# Patient Record
Sex: Female | Born: 1985 | Race: Black or African American | Hispanic: No | Marital: Married | Smoking: Never smoker
Health system: Southern US, Community
[De-identification: ages and names within clinical notes are randomized; demographics above are authoritative.]

## PROBLEM LIST (undated history)

## (undated) DIAGNOSIS — O009 Unspecified ectopic pregnancy without intrauterine pregnancy: Secondary | ICD-10-CM

## (undated) HISTORY — PX: TUBAL LIGATION: SHX77

---

## 2017-05-04 DIAGNOSIS — O009 Unspecified ectopic pregnancy without intrauterine pregnancy: Secondary | ICD-10-CM

## 2017-05-04 HISTORY — DX: Unspecified ectopic pregnancy without intrauterine pregnancy: O00.90

## 2018-07-10 ENCOUNTER — Other Ambulatory Visit: Payer: Self-pay

## 2018-07-10 ENCOUNTER — Encounter (HOSPITAL_COMMUNITY): Payer: Self-pay | Admitting: *Deleted

## 2018-07-10 ENCOUNTER — Emergency Department (HOSPITAL_COMMUNITY)
Admission: EM | Admit: 2018-07-10 | Discharge: 2018-07-10 | Disposition: A | Discharge status: Home / Self Care | Payer: Self-pay | Attending: Emergency Medicine | Admitting: Emergency Medicine

## 2018-07-10 DIAGNOSIS — N912 Amenorrhea, unspecified: Secondary | ICD-10-CM | POA: Insufficient documentation

## 2018-07-10 HISTORY — DX: Unspecified ectopic pregnancy without intrauterine pregnancy: O00.90

## 2018-07-10 LAB — URINALYSIS, ROUTINE W REFLEX MICROSCOPIC
Bilirubin Urine: NEGATIVE
Glucose, UA: NEGATIVE mg/dL
Hgb urine dipstick: NEGATIVE
Ketones, ur: NEGATIVE mg/dL
LEUKOCYTE UA: NEGATIVE
NITRITE: NEGATIVE
Protein, ur: NEGATIVE mg/dL
Specific Gravity, Urine: 1.019 (ref 1.005–1.030)
pH: 6 (ref 5.0–8.0)

## 2018-07-10 LAB — POC URINE PREG, ED: PREG TEST UR: NEGATIVE

## 2018-07-10 NOTE — Discharge Instructions (Signed)
The pregnancy test today was positive.  It may be helpful to repeat a home pregnancy test in a week or two. Follow-up with OB/GYN for any further assessment or management of your symptoms.

## 2018-07-10 NOTE — ED Provider Notes (Signed)
[facility] EMERGENCY DEPARTMENT Provider Note   CSN: [id number] Arrival date & time: 07/10/18  1331    History   Chief Complaint Chief Complaint  Patient presents with  . Possible Pregnancy    HPI Jean Nichols is a 33 y.o. female.     HPI   Jean Nichols is a 33 y.o. female, with a history of tubal ligation, presenting to the ED with missed menstrual cycle.  LMP January 25 and states she is typically regular.  Endorses breast tenderness, nausea, and urinary frequency for the last week. She thinks she may be pregnant, but has not taken a pregnancy test. Patient had a tubal ligation 5 years ago, however, she states she became pregnant October 2019 and resulted in an induced abortion.  Denies fever/chills, vomiting, diarrhea, abdominal pain, vaginal bleeding, abnormal vaginal discharge, or any other complaints.  Past Medical History:  Diagnosis Date  . Ectopic pregnancy 2019    Patient Active Problem List   Diagnosis Date Noted  . Ectopic pregnancy 05/04/2017    History reviewed. No pertinent surgical history.   OB History    Gravida  1   Para      Term      Preterm      AB      Living        SAB      TAB      Ectopic      Multiple      Live Births               Home Medications    Prior to Admission medications   Not on File    Family History History reviewed. No pertinent family history.  Social History Social History   Tobacco Use  . Smoking status: Never Smoker  . Smokeless tobacco: Never Used  Substance Use Topics  . Alcohol use: Never    Frequency: Never  . Drug use: Never     Allergies   Patient has no allergy information on record.   Review of Systems Review of Systems  Constitutional: Negative for chills, diaphoresis and fever.  Respiratory: Negative for shortness of breath.   Cardiovascular: Negative for chest pain.  Gastrointestinal: Positive for nausea. Negative for abdominal pain,  diarrhea and vomiting.  Genitourinary: Positive for frequency and menstrual problem. Negative for vaginal bleeding and vaginal discharge.  Musculoskeletal: Negative for back pain.  All other systems reviewed and are negative.    Physical Exam Updated Vital Signs BP 118/69   Pulse 99   Temp 98.1 F (36.7 C) (Oral)   Resp 16   SpO2 99%   Physical Exam Vitals signs and nursing note reviewed.  Constitutional:      General: She is not in acute distress.    Appearance: She is well-developed. She is not diaphoretic.  HENT:     Head: Normocephalic and atraumatic.     Mouth/Throat:     Mouth: Mucous membranes are moist.     Pharynx: Oropharynx is clear.  Eyes:     Conjunctiva/sclera: Conjunctivae normal.  Neck:     Musculoskeletal: Neck supple.  Cardiovascular:     Rate and Rhythm: Normal rate and regular rhythm.     Pulses: Normal pulses.     Comments: Tactile temperature in the extremities appropriate and equal bilaterally. Pulmonary:     Effort: Pulmonary effort is normal. No respiratory distress.  Abdominal:     Palpations: Abdomen is soft.     Tenderness:  There is no abdominal tenderness. There is no guarding.  Musculoskeletal:     Right lower leg: No edema.     Left lower leg: No edema.  Lymphadenopathy:     Cervical: No cervical adenopathy.  Skin:    General: Skin is warm and dry.  Neurological:     Mental Status: She is alert.  Psychiatric:        Mood and Affect: Mood and affect normal.        Speech: Speech normal.        Behavior: Behavior normal.      ED Treatments / Results  Labs (all labs ordered are listed, but only abnormal results are displayed) Labs Reviewed  URINALYSIS, ROUTINE W REFLEX MICROSCOPIC  POC URINE PREG, ED    EKG None  Radiology No results found.  Procedures Procedures (including critical care time)  Medications Ordered in ED Medications - No data to display   Initial Impression / Assessment and Plan / ED Course  I  have reviewed the triage vital signs and the nursing notes.  Pertinent labs & imaging results that were available during my care of the patient were reviewed by me and considered in my medical decision making (see chart for details).        Patient presents with a complaint of a missed menstrual cycle accompanied by nausea and breast tenderness.  Negative pregnancy test.  UA unremarkable.  OB/GYN follow-up for any further management.  Return precautions discussed. Patient voices understanding of these instructions, accepts the plan, and is comfortable with discharge.  Final Clinical Impressions(s) / ED Diagnoses   Final diagnoses:  Amenorrhea    ED Discharge Orders    None       Concepcion Living 07/10/18 1458    Linwood Dibbles, MD 07/12/18 [phone]

## 2018-07-10 NOTE — ED Triage Notes (Addendum)
PT presents today for pregnancy test.  LMP first of FEB. 2020. Pt reports she has had a tubal ligation. Pt reports Breast tenderness and nausea. Pt reports a tubal pregnancy last year.

## 2018-07-10 NOTE — ED Notes (Signed)
Urine preg

## 2018-07-10 NOTE — ED Notes (Signed)
Patient verbalizes understanding of discharge instructions . Opportunity for questions and answers were provided . Armband removed by staff ,Pt discharged from ED. W/C  offered at D/C  and Declined W/C at D/C and was escorted to lobby by RN.  

## 2018-07-12 ENCOUNTER — Encounter: Payer: Self-pay | Admitting: Internal Medicine

## 2018-07-12 ENCOUNTER — Telehealth: Payer: Self-pay | Admitting: Internal Medicine

## 2018-07-12 ENCOUNTER — Encounter (HOSPITAL_COMMUNITY): Payer: Self-pay | Admitting: *Deleted

## 2018-07-12 ENCOUNTER — Emergency Department (HOSPITAL_COMMUNITY)
Admission: EM | Admit: 2018-07-12 | Discharge: 2018-07-13 | Disposition: A | Discharge status: Home / Self Care | Payer: Self-pay | Attending: Emergency Medicine | Admitting: Emergency Medicine

## 2018-07-12 ENCOUNTER — Ambulatory Visit (INDEPENDENT_AMBULATORY_CARE_PROVIDER_SITE_OTHER): Payer: Self-pay | Admitting: Internal Medicine

## 2018-07-12 ENCOUNTER — Other Ambulatory Visit: Payer: Self-pay

## 2018-07-12 VITALS — BP 143/91 | HR 89 | Wt 176.0 lb

## 2018-07-12 DIAGNOSIS — Z5321 Procedure and treatment not carried out due to patient leaving prior to being seen by health care provider: Secondary | ICD-10-CM | POA: Insufficient documentation

## 2018-07-12 DIAGNOSIS — R109 Unspecified abdominal pain: Secondary | ICD-10-CM | POA: Insufficient documentation

## 2018-07-12 DIAGNOSIS — N912 Amenorrhea, unspecified: Secondary | ICD-10-CM

## 2018-07-12 LAB — CBC
HCT: 40.4 % (ref 36.0–46.0)
Hemoglobin: 13.7 g/dL (ref 12.0–15.0)
MCH: 29.5 pg (ref 26.0–34.0)
MCHC: 33.9 g/dL (ref 30.0–36.0)
MCV: 87.1 fL (ref 80.0–100.0)
PLATELETS: 267 10*3/uL (ref 150–400)
RBC: 4.64 MIL/uL (ref 3.87–5.11)
RDW: 12.7 % (ref 11.5–15.5)
WBC: 14 10*3/uL — AB (ref 4.0–10.5)
nRBC: 0 % (ref 0.0–0.2)

## 2018-07-12 LAB — URINALYSIS, ROUTINE W REFLEX MICROSCOPIC
Bilirubin Urine: NEGATIVE
Glucose, UA: NEGATIVE mg/dL
Hgb urine dipstick: NEGATIVE
Ketones, ur: NEGATIVE mg/dL
Leukocytes,Ua: NEGATIVE
NITRITE: NEGATIVE
Protein, ur: NEGATIVE mg/dL
Specific Gravity, Urine: 1.017 (ref 1.005–1.030)
pH: 6 (ref 5.0–8.0)

## 2018-07-12 LAB — COMPREHENSIVE METABOLIC PANEL
ALK PHOS: 114 U/L (ref 38–126)
ALT: 20 U/L (ref 0–44)
AST: 21 U/L (ref 15–41)
Albumin: 3.6 g/dL (ref 3.5–5.0)
Anion gap: 7 (ref 5–15)
BUN: 7 mg/dL (ref 6–20)
CO2: 24 mmol/L (ref 22–32)
Calcium: 9.2 mg/dL (ref 8.9–10.3)
Chloride: 105 mmol/L (ref 98–111)
Creatinine, Ser: 0.76 mg/dL (ref 0.44–1.00)
GFR calc Af Amer: >60 mL/min (ref >60)
GFR calc non Af Amer: >60 mL/min (ref >60)
Glucose, Bld: 151 mg/dL — ABNORMAL HIGH (ref 70–99)
Potassium: 3.6 mmol/L (ref 3.5–5.1)
Sodium: 136 mmol/L (ref 135–145)
Total Bilirubin: 0.4 mg/dL (ref 0.3–1.2)
Total Protein: 7.3 g/dL (ref 6.5–8.1)

## 2018-07-12 LAB — LIPASE, BLOOD: Lipase: 40 U/L (ref 11–51)

## 2018-07-12 LAB — I-STAT BETA HCG BLOOD, ED (MC, WL, AP ONLY): I-stat hCG, quantitative: <5 m[IU]/mL (ref <5)

## 2018-07-12 LAB — POCT PREGNANCY, URINE: Preg Test, Ur: NEGATIVE

## 2018-07-12 MED ORDER — SODIUM CHLORIDE 0.9% FLUSH: 3.0000 mL | Freq: Once | INTRAVENOUS | Status: DC

## 2018-07-12 NOTE — ED Triage Notes (Signed)
Pain in the midline stomach for several days. Reports being seen here for the same and had + pregnancy test. Hx of tubal ligation. Has had vomiting, no fevers. Went to [facility] today and they did blood work, will get results tomorrow.

## 2018-07-12 NOTE — Patient Instructions (Addendum)
We will call you with your lab results. We are checking a pregnancy hormone level in your blood as well as your thyroid hormone and hormones related to your ovaries.   If you have severe abdominal pain, heavy vaginal bleeding, significant nausea/vomiting you should seek care immediately.

## 2018-07-12 NOTE — Progress Notes (Signed)
   Subjective:    Jean Nichols - 33 y.o. female MRN [id number]  Date of birth: 09/24/1985  HPI  Jean Nichols is a 33 y.o. G3P3 female here for missed menses. Reports first day of LMP is 1/25. Patient is s/p bilateral tubal ligation. Reports menstrual periods have always been regular. She has never missed a period before without being pregnant. She reports breast tenderness, nausea and abdominal cramping; symptoms she feels is consistent with early pregnancy. She reports the cramping is midline and not particularly severe. She denies vaginal bleeding and vaginal discharge. Had recent ED visit on 3/8 with negative Upreg and negative UA. Patient denies history of hiristisum and acne. Denies history of thyroid disorders. Was last pregnant in 2015. Reports she last used DepoProvera in 2010. Otherwise, no hormonal contraception used.     OB History    Gravida  1   Para      Term      Preterm      AB      Living        SAB      TAB      Ectopic      Multiple      Live Births               -  reports that she has never smoked. She has never used smokeless tobacco. - Review of Systems: Per HPI. - Past Medical History: Patient Active Problem List   Diagnosis Date Noted  . Ectopic pregnancy 05/04/2017   - Medications: reviewed and updated   Objective:   Physical Exam BP (!) 143/91   Pulse 89   Wt 176 lb (79.8 kg)   BMI 32.19 kg/m  Gen: NAD, alert, cooperative with exam, well-appearing HEENT: NCAT, PERRL, clear conjunctiva, oropharynx clear, supple neck CV: RRR, good S1/S2, no murmur, no edema, capillary refill brisk  Resp: CTABL, no wheezes, non-labored Abd: SNTND, BS present, no guarding or organomegaly Skin: no rashes, normal turgor  Neuro: no gross deficits.  Psych: good insight, alert and oriented      Assessment & Plan:   1. Amenorrhea Patient is well appearing with benign abdominal exam and normal vital signs. Upreg in office negative. Will check beta  hcg to ensure not early pregnancy. Discussed with patient if positive, will need ultrasound to confirm location of pregnancy. Will check other labs to evaluate for other etiologies of amenorrhea. Strict return precautions discussed.  - Beta hCG quant (ref lab) - Follicle stimulating hormone - Prolactin - TSH   Routine preventative health maintenance measures emphasized. Please refer to After Visit Summary for other counseling recommendations.   No follow-ups on file.  Marcy Siren, D.O. OB Fellow  07/12/2018, 4:57 PM

## 2018-07-12 NOTE — ED Notes (Signed)
Pt. Stated she was going to check out and come back in the morning due to the long wait times.

## 2018-07-12 NOTE — ED Notes (Signed)
Pt reports her + pregnancy test was when she was here, noted as negative in results.

## 2018-07-12 NOTE — Telephone Encounter (Signed)
Patient called in stating she is experiencing pain in her stomach. Stated she is a gyn visit. The patient also asked if she could come in to our walk-in clinic. Informed of the hours and educated yes. The patient stated she did not want to wait until 4pm. Informed of other options such as ER and or Urgent care.

## 2018-07-13 LAB — FOLLICLE STIMULATING HORMONE: FSH: 3.3 m[IU]/mL

## 2018-07-13 LAB — TSH: TSH: 1.36 u[IU]/mL (ref 0.450–4.500)

## 2018-07-13 LAB — BETA HCG QUANT (REF LAB): hCG Quant: <1 m[IU]/mL

## 2018-07-13 LAB — PROLACTIN: Prolactin: 22.7 ng/mL (ref 4.8–23.3)

## 2018-07-15 ENCOUNTER — Telehealth: Payer: Self-pay

## 2018-07-15 NOTE — Telephone Encounter (Signed)
Pt called requesting test results.  I informed pt that her results are normal and that she is not pregnant.

## 2019-03-16 ENCOUNTER — Emergency Department (HOSPITAL_COMMUNITY)
Admission: EM | Admit: 2019-03-16 | Discharge: 2019-03-17 | Disposition: A | Discharge status: Home / Self Care | Payer: Self-pay | Attending: Emergency Medicine | Admitting: Emergency Medicine

## 2019-03-16 ENCOUNTER — Other Ambulatory Visit: Payer: Self-pay

## 2019-03-16 DIAGNOSIS — R109 Unspecified abdominal pain: Secondary | ICD-10-CM | POA: Insufficient documentation

## 2019-03-16 DIAGNOSIS — Z5321 Procedure and treatment not carried out due to patient leaving prior to being seen by health care provider: Secondary | ICD-10-CM | POA: Insufficient documentation

## 2019-03-16 LAB — I-STAT BETA HCG BLOOD, ED (MC, WL, AP ONLY): I-stat hCG, quantitative: <5 m[IU]/mL (ref <5)

## 2019-03-16 NOTE — ED Notes (Signed)
Pt stated that she couldn't wait any longer and needed to get her son home. Pt also stated that she will go to her primary care in the morning. RN, Kathie Rhodes., notified.

## 2019-03-16 NOTE — ED Triage Notes (Signed)
Pt presents suprapubic pain x4 days, started her cycle yesterday morning, usually starts her cycle at the beginning of the month w/a heavy flow but is only spotting with this cycle. Pt also reports dysuria

## 2019-06-01 ENCOUNTER — Ambulatory Visit: Payer: Self-pay | Attending: Internal Medicine

## 2019-06-01 ENCOUNTER — Emergency Department (HOSPITAL_COMMUNITY)
Admission: EM | Admit: 2019-06-01 | Discharge: 2019-06-01 | Disposition: A | Discharge status: Home / Self Care | Payer: HRSA Program | Attending: Emergency Medicine | Admitting: Emergency Medicine

## 2019-06-01 ENCOUNTER — Other Ambulatory Visit: Payer: Self-pay

## 2019-06-01 ENCOUNTER — Encounter (HOSPITAL_COMMUNITY): Payer: Self-pay | Admitting: Emergency Medicine

## 2019-06-01 DIAGNOSIS — R0981 Nasal congestion: Secondary | ICD-10-CM | POA: Diagnosis present

## 2019-06-01 DIAGNOSIS — Z20822 Contact with and (suspected) exposure to covid-19: Secondary | ICD-10-CM | POA: Insufficient documentation

## 2019-06-01 DIAGNOSIS — Z1152 Encounter for screening for COVID-19: Secondary | ICD-10-CM

## 2019-06-01 NOTE — ED Triage Notes (Signed)
Patient reports exposure to Covid+ co-workers , pt. stated nasal congestion and loss of taste this week , respirations unlabored , denies fever or chills .

## 2019-06-01 NOTE — Discharge Instructions (Signed)
Quarantine pending your test results, follow Jean Nichols protocol after results are available.

## 2019-06-01 NOTE — ED Provider Notes (Signed)
[facility] EMERGENCY DEPARTMENT Provider Note   CSN: [id number] Arrival date & time: 06/01/19  2025     History Chief Complaint  Patient presents with  . Covid Exposure/Nasal Congestion/Loss of taste    Jean Nichols is a 34 y.o. female.  34 year old female presents with complaint of nasal congestion onset today.  Patient states that she works at the [employer], is tested every Tuesday and Thursday, was tested earlier today but does not believe she will have her results back for up to 5 days.  Patient states her stools have been a little soft today, has not had much of an appetite so she is not sure if she can taste food because she has not eaten today.  Patient denies abdominal pain, nausea, vomiting, body aches, cough or shortness of breath.  No other complaints or concerns.  Patient has not been vaccinated for COVID-19 as of yet.  Jean Nichols was evaluated in Emergency Department on 06/01/2019 for the symptoms described in the history of present illness. She was evaluated in the context of the global COVID-19 pandemic, which necessitated consideration that the patient might be at risk for infection with the SARS-CoV-2 virus that causes COVID-19. Institutional protocols and algorithms that pertain to the evaluation of patients at risk for COVID-19 are in a state of rapid change based on information released by regulatory bodies including the CDC and federal and state organizations. These policies and algorithms were followed during the patient's care in the ED.         Past Medical History:  Diagnosis Date  . Ectopic pregnancy 2019    Patient Active Problem List   Diagnosis Date Noted  . Ectopic pregnancy 05/04/2017    Past Surgical History:  Procedure Laterality Date  . TUBAL LIGATION       OB History    Gravida  1   Para      Term      Preterm      AB      Living        SAB      TAB      Ectopic      Multiple      Live Births             No family history on file.  Social History   Tobacco Use  . Smoking status: Never Smoker  . Smokeless tobacco: Never Used  Substance Use Topics  . Alcohol use: Never  . Drug use: Never    Home Medications Prior to Admission medications   Not on File    Allergies    Amoxicillin  Review of Systems   Review of Systems  Constitutional: Positive for appetite change. Negative for chills, diaphoresis and fever.  HENT: Positive for congestion. Negative for sore throat.   Respiratory: Negative for cough and shortness of breath.   Cardiovascular: Negative for chest pain.  Gastrointestinal: Positive for diarrhea. Negative for abdominal pain, constipation, nausea and vomiting.  Musculoskeletal: Negative for arthralgias and myalgias.  Skin: Negative for rash and wound.  Allergic/Immunologic: Negative for immunocompromised state.  Neurological: Negative for weakness and headaches.  Hematological: Negative for adenopathy.  Psychiatric/Behavioral: Negative for confusion.  All other systems reviewed and are negative.   Physical Exam Updated Vital Signs BP 132/82 (BP Location: Right Arm)   Pulse 88   Temp 98.5 F (36.9 C) (Oral)   Resp 16   LMP 05/20/2019   SpO2 98%   Physical Exam Vitals  and nursing note reviewed.  Constitutional:      General: She is not in acute distress.    Appearance: She is well-developed. She is not diaphoretic.  HENT:     Head: Normocephalic and atraumatic.  Eyes:     Conjunctiva/sclera: Conjunctivae normal.  Cardiovascular:     Rate and Rhythm: Normal rate and regular rhythm.     Pulses: Normal pulses.     Heart sounds: Normal heart sounds.  Pulmonary:     Effort: Pulmonary effort is normal.     Breath sounds: Normal breath sounds.  Abdominal:     Palpations: Abdomen is soft.     Tenderness: There is no abdominal tenderness.  Musculoskeletal:     Cervical back: Neck supple.  Lymphadenopathy:     Cervical: No cervical adenopathy.   Skin:    General: Skin is warm and dry.     Findings: No erythema or rash.  Neurological:     Mental Status: She is alert and oriented to person, place, and time.  Psychiatric:        Behavior: Behavior normal.     ED Results / Procedures / Treatments   Labs (all labs ordered are listed, but only abnormal results are displayed) Labs Reviewed  NOVEL CORONAVIRUS, NAA (HOSP ORDER, SEND-OUT TO REF LAB; TAT 18-24 HRS)    EKG None  Radiology No results found.  Procedures Procedures (including critical care time)  Medications Ordered in ED Medications - No data to display  ED Course  I have reviewed the triage vital signs and the nursing notes.  Pertinent labs & imaging results that were available during my care of the patient were reviewed by me and considered in my medical decision making (see chart for details).  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  Patient developed congestion today, was tested at work however would not have results for 5 days.  Patient will be tested today, advised to quarantine pending her results and follow her employer's protocol.   [LM]    Clinical Course User Index [LM] Roque Lias   MDM Rules/Calculators/A&P                      Final Clinical Impression(s) / ED Diagnoses Final diagnoses:  Nasal congestion  Encounter for screening for COVID-19    Rx / DC Orders ED Discharge Orders    None       Roque Lias 06/01/19 2124    Varney Biles, MD 06/02/19 1622

## 2019-06-02 LAB — NOVEL CORONAVIRUS, NAA: SARS-CoV-2, NAA: NOT DETECTED

## 2019-06-03 LAB — NOVEL CORONAVIRUS, NAA (HOSP ORDER, SEND-OUT TO REF LAB; TAT 18-24 HRS): SARS-CoV-2, NAA: NOT DETECTED

## 2019-08-07 ENCOUNTER — Other Ambulatory Visit: Payer: Self-pay

## 2019-08-07 ENCOUNTER — Encounter (HOSPITAL_COMMUNITY): Payer: Self-pay | Admitting: Emergency Medicine

## 2019-08-07 ENCOUNTER — Emergency Department (HOSPITAL_COMMUNITY)
Admission: EM | Admit: 2019-08-07 | Discharge: 2019-08-07 | Disposition: A | Discharge status: Home / Self Care | Payer: Self-pay | Attending: Emergency Medicine | Admitting: Emergency Medicine

## 2019-08-07 DIAGNOSIS — T148XXA Other injury of unspecified body region, initial encounter: Secondary | ICD-10-CM

## 2019-08-07 DIAGNOSIS — M542 Cervicalgia: Secondary | ICD-10-CM | POA: Insufficient documentation

## 2019-08-07 DIAGNOSIS — Y999 Unspecified external cause status: Secondary | ICD-10-CM | POA: Insufficient documentation

## 2019-08-07 DIAGNOSIS — X500XXA Overexertion from strenuous movement or load, initial encounter: Secondary | ICD-10-CM | POA: Insufficient documentation

## 2019-08-07 DIAGNOSIS — S29012A Strain of muscle and tendon of back wall of thorax, initial encounter: Secondary | ICD-10-CM | POA: Insufficient documentation

## 2019-08-07 DIAGNOSIS — S161XXA Strain of muscle, fascia and tendon at neck level, initial encounter: Secondary | ICD-10-CM | POA: Insufficient documentation

## 2019-08-07 DIAGNOSIS — Y939 Activity, unspecified: Secondary | ICD-10-CM | POA: Insufficient documentation

## 2019-08-07 DIAGNOSIS — Y929 Unspecified place or not applicable: Secondary | ICD-10-CM | POA: Insufficient documentation

## 2019-08-07 MED ORDER — METHOCARBAMOL 500 MG PO TABS: 500.0000 mg | ORAL_TABLET | Freq: Two times a day (BID) | ORAL | 0 refills | Status: AC

## 2019-08-07 MED ORDER — IBUPROFEN 800 MG PO TABS: 800.0000 mg | ORAL_TABLET | Freq: Three times a day (TID) | ORAL | 0 refills | Status: AC

## 2019-08-07 NOTE — Discharge Instructions (Addendum)
You were seen in the ER for a mid back muscle strain.  Please take the prescribed ibuprofen up to 3 times a day as needed.  I have prescribed a muscle relaxer called Robaxin as well.  This medication can have drowsy side effects, start taking it at night to assess its effects on you.  Make sure you are not pregnant while taking this medication.  Please call Dr. Eulah Pont with orthopedics to schedule an appointment by calling the number provided on your discharge summary.  There is also phone number in your discharge summary to establish with a primary care provider.  Return to the ER if your symptoms worsen.

## 2019-08-07 NOTE — ED Triage Notes (Signed)
Onset 2 days ago while at work developed lower back pain radiating to upper back and shoulders. States taking OTC medication with little to no relief.

## 2019-08-07 NOTE — ED Provider Notes (Signed)
[facility] EMERGENCY DEPARTMENT Provider Note   CSN: [id number] Arrival date & time: 08/07/19  0913     History Chief Complaint  Patient presents with  . Back Pain    Jean Nichols is a 34 y.o. female.  HPI  34 year old female with a history of an ectopic pregnancy presents to the ER with right-sided back pain.  Patient states that she was working on Saturday at the [employer] when she lifted a box and had a sudden onset of mid back pain that traveled up to her shoulder blades.  She states that she has taken 1 800 mg ibuprofen which did not help, so she decided to go to the ER.  She has been able to ambulate and use her upper extremities but with pain. Rates pain 9/10. She denies numbness, fever, chills, tingling, weakness, bowel/bladder incontinence, IV drug use.      Past Medical History:  Diagnosis Date  . Ectopic pregnancy 2019    Patient Active Problem List   Diagnosis Date Noted  . Ectopic pregnancy 05/04/2017    Past Surgical History:  Procedure Laterality Date  . TUBAL LIGATION       OB History    Gravida  1   Para      Term      Preterm      AB      Living        SAB      TAB      Ectopic      Multiple      Live Births              No family history on file.  Social History   Tobacco Use  . Smoking status: Never Smoker  . Smokeless tobacco: Never Used  Substance Use Topics  . Alcohol use: Never  . Drug use: Never    Home Medications Prior to Admission medications   Medication Sig Start Date End Date Taking? Authorizing Provider  ibuprofen (ADVIL) 800 MG tablet Take 1 tablet (800 mg total) by mouth 3 (three) times daily. 08/07/19   Mare Ferrari, PA-C  methocarbamol (ROBAXIN) 500 MG tablet Take 1 tablet (500 mg total) by mouth 2 (two) times daily. 08/07/19   Mare Ferrari, PA-C    Allergies    Amoxicillin  Review of Systems   Review of Systems  Constitutional: Negative for chills and fever.    Musculoskeletal: Positive for back pain, neck pain and neck stiffness. Negative for joint swelling and myalgias.  Neurological: Negative for weakness and numbness.    Physical Exam Updated Vital Signs BP (!) 111/58 (BP Location: Left Arm)   Pulse 99   Temp 98.2 F (36.8 C) (Oral)   Resp 20   Ht 5\' 2"  (1.575 m)   Wt 79.4 kg   SpO2 96%   BMI 32.01 kg/m   Physical Exam Constitutional:      Appearance: Normal appearance.  Cardiovascular:     Rate and Rhythm: Normal rate and regular rhythm.  Pulmonary:     Effort: Pulmonary effort is normal.     Breath sounds: Normal breath sounds.  Musculoskeletal:        General: Tenderness present. No swelling, deformity or signs of injury. Normal range of motion.     Comments: No swelling, erythema, stepoffs, strength and sensations intact in upper and lower extremities, mild TTP in paraspinal thoracic and cervical musculature. Normal ROM w/ mild pain   Skin:  General: Skin is warm and dry.     Findings: No bruising, erythema or lesion.  Neurological:     General: No focal deficit present.     Mental Status: She is alert.     ED Results / Procedures / Treatments   Labs (all labs ordered are listed, but only abnormal results are displayed) Labs Reviewed - No data to display  EKG None  Radiology No results found.  Procedures Procedures (including critical care time)  Medications Ordered in ED Medications - No data to display  ED Course  I have reviewed the triage vital signs and the nursing notes.  Pertinent labs & imaging results that were available during my care of the patient were reviewed by me and considered in my medical decision making (see chart for details).    MDM Rules/Calculators/A&P                      34 year old female with upper/mid back pain. On presentation patient is in no acute distress.  Vitals nonconcerning.  Physical exam positive for tenderness to palpation in the cervical and thoracic  paraspinal musculature.  No midline tenderness.  No step-offs, redness, swelling appreciated.  No red flags.  Suspicion for disc herniation, fracture, abscess low.  Given mechanism of injury, suspect cervical/thoracic muscle strain.  Robaxin, ibuprofen for symptomatic management. Cautioned patient about taking these medications if pregnant. Encouraged patient to stretch as tolerated.  Patient requesting referral to chiropractor, I informed her that she needs to do this to her primary care provider.  We did agree on a orthopedic referral if her symptoms do not improve.  Patient voiced understanding is and agreeable to this plan.  Return precautions given.  Final Clinical Impression(s) / ED Diagnoses Final diagnoses:  Muscle strain    Rx / DC Orders ED Discharge Orders         Ordered    methocarbamol (ROBAXIN) 500 MG tablet  2 times daily     08/07/19 1024    ibuprofen (ADVIL) 800 MG tablet  3 times daily     08/07/19 1024           Lyndel Safe 08/07/19 1135    Noemi Chapel, MD 08/09/19 [phone]

## 2019-11-16 ENCOUNTER — Other Ambulatory Visit: Payer: Self-pay

## 2019-11-16 ENCOUNTER — Emergency Department (HOSPITAL_COMMUNITY): Payer: 59

## 2019-11-16 ENCOUNTER — Encounter (HOSPITAL_COMMUNITY): Payer: Self-pay

## 2019-11-16 DIAGNOSIS — Z5321 Procedure and treatment not carried out due to patient leaving prior to being seen by health care provider: Secondary | ICD-10-CM | POA: Diagnosis not present

## 2019-11-16 DIAGNOSIS — R1013 Epigastric pain: Secondary | ICD-10-CM | POA: Diagnosis not present

## 2019-11-16 DIAGNOSIS — R05 Cough: Secondary | ICD-10-CM | POA: Diagnosis not present

## 2019-11-16 LAB — COMPREHENSIVE METABOLIC PANEL
ALT: 23 U/L (ref 0–44)
AST: 21 U/L (ref 15–41)
Albumin: 4.2 g/dL (ref 3.5–5.0)
Alkaline Phosphatase: 118 U/L (ref 38–126)
Anion gap: 11 (ref 5–15)
BUN: 9 mg/dL (ref 6–20)
CO2: 25 mmol/L (ref 22–32)
Calcium: 9.5 mg/dL (ref 8.9–10.3)
Chloride: 107 mmol/L (ref 98–111)
Creatinine, Ser: 0.8 mg/dL (ref 0.44–1.00)
GFR calc Af Amer: >60 mL/min (ref >60)
GFR calc non Af Amer: >60 mL/min (ref >60)
Glucose, Bld: 125 mg/dL — ABNORMAL HIGH (ref 70–99)
Potassium: 3.5 mmol/L (ref 3.5–5.1)
Sodium: 143 mmol/L (ref 135–145)
Total Bilirubin: 0.5 mg/dL (ref 0.3–1.2)
Total Protein: 8 g/dL (ref 6.5–8.1)

## 2019-11-16 LAB — I-STAT BETA HCG BLOOD, ED (NOT ORDERABLE): I-stat hCG, quantitative: <5 m[IU]/mL (ref <5)

## 2019-11-16 LAB — CBC
HCT: 41.6 % (ref 36.0–46.0)
Hemoglobin: 14.2 g/dL (ref 12.0–15.0)
MCH: 29.8 pg (ref 26.0–34.0)
MCHC: 34.1 g/dL (ref 30.0–36.0)
MCV: 87.2 fL (ref 80.0–100.0)
Platelets: 293 10*3/uL (ref 150–400)
RBC: 4.77 MIL/uL (ref 3.87–5.11)
RDW: 13 % (ref 11.5–15.5)
WBC: 12 10*3/uL — ABNORMAL HIGH (ref 4.0–10.5)
nRBC: 0 % (ref 0.0–0.2)

## 2019-11-16 LAB — LIPASE, BLOOD: Lipase: 42 U/L (ref 11–51)

## 2019-11-16 NOTE — ED Triage Notes (Signed)
Pt reports epigastric abdominal pain that is worse with coughing. She also reports a dry cough and pain with deep breathing. A&Ox4. No cardiac hx.

## 2019-11-17 ENCOUNTER — Emergency Department (HOSPITAL_COMMUNITY)
Admission: EM | Admit: 2019-11-17 | Discharge: 2019-11-17 | Disposition: A | Discharge status: Home / Self Care | Payer: 59 | Attending: Emergency Medicine | Admitting: Emergency Medicine

## 2019-11-17 NOTE — ED Notes (Signed)
Pt returned her stickers to registration.  

## 2020-01-19 ENCOUNTER — Other Ambulatory Visit: Payer: Self-pay

## 2020-01-19 ENCOUNTER — Emergency Department (HOSPITAL_COMMUNITY)
Admission: EM | Admit: 2020-01-19 | Discharge: 2020-01-20 | Disposition: A | Discharge status: Home / Self Care | Payer: 59 | Attending: Emergency Medicine | Admitting: Emergency Medicine

## 2020-01-19 ENCOUNTER — Encounter (HOSPITAL_COMMUNITY): Payer: Self-pay

## 2020-01-19 DIAGNOSIS — U071 COVID-19: Secondary | ICD-10-CM

## 2020-01-19 DIAGNOSIS — R509 Fever, unspecified: Secondary | ICD-10-CM | POA: Diagnosis present

## 2020-01-19 LAB — SARS CORONAVIRUS 2 BY RT PCR (HOSPITAL ORDER, PERFORMED IN ~~LOC~~ HOSPITAL LAB): SARS Coronavirus 2: POSITIVE — AB

## 2020-01-19 MED ORDER — BENZONATATE 100 MG PO CAPS
100.0000 mg | ORAL_CAPSULE | Freq: Three times a day (TID) | ORAL | 0 refills | Status: DC | PRN
Start: 2020-01-19 — End: 2020-01-27

## 2020-01-19 NOTE — ED Triage Notes (Signed)
Patient states her husband tested Covid +.  Patient states that she received her Covid vaccine 2 days ago  patient c/o [place] of taste and smell, headache, and body aches.

## 2020-01-19 NOTE — ED Provider Notes (Signed)
[facility]-EMERGENCY DEPT Provider Note   CSN: [id number] Arrival date & time: 01/19/20  1822     History Chief Complaint  Patient presents with  . loss of taste  . loss of smell  . Covid Exposure  . Generalized Body Aches    Jean Nichols is a 34 y.o. female with a hx of ectopic pregnancy presents to the Emergency Department complaining of gradual, persistent, progressively worsening fever, chills, headache, body aches, fatigue onset 3 days ago.  Pt reports her husband is COVID + and she was exposed initially last week. 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  Pt denies N/V/D, syncope, dysuria.  She has taken theraflu and ibuprofen without relief.  No aggravating factors.   The history is provided by the patient and medical records. No language interpreter was used.       Past Medical History:  Diagnosis Date  . Ectopic pregnancy 2019    Patient Active Problem List   Diagnosis Date Noted  . Ectopic pregnancy 05/04/2017    Past Surgical History:  Procedure Laterality Date  . TUBAL LIGATION       OB History    Gravida  1   Para      Term      Preterm      AB      Living        SAB      TAB      Ectopic      Multiple      Live Births              Family History  Family history unknown: Yes    Social History   Tobacco Use  . Smoking status: Never Smoker  . Smokeless tobacco: Never Used  Vaping Use  . Vaping Use: Never used  Substance Use Topics  . Alcohol use: Never  . Drug use: Never    Home Medications Prior to Admission medications   Medication Sig Start Date End Date Taking? Authorizing Provider  benzonatate (TESSALON PERLES) 100 MG capsule Take 1 capsule (100 mg total) by mouth 3 (three) times daily as needed for cough (cough). 01/19/20   Mariany Mackintosh, Dahlia Client, PA-C  ibuprofen (ADVIL) 800 MG tablet Take 1 tablet (800 mg total) by mouth 3 (three) times daily.  08/07/19   Mare Ferrari, PA-C  methocarbamol (ROBAXIN) 500 MG tablet Take 1 tablet (500 mg total) by mouth 2 (two) times daily. 08/07/19   Mare Ferrari, PA-C    Allergies    Amoxicillin  Review of Systems   Review of Systems  Constitutional: Positive for chills, fatigue and fever. Negative for appetite change, diaphoresis and unexpected weight change.  HENT: Negative for mouth sores.   Eyes: Negative for visual disturbance.  Respiratory: Negative for cough, chest tightness, shortness of breath and wheezing.   Cardiovascular: Negative for chest pain.  Gastrointestinal: Negative for abdominal pain, constipation, diarrhea, nausea and vomiting.  Endocrine: Negative for polydipsia, polyphagia and polyuria.  Genitourinary: Negative for dysuria, frequency, hematuria and urgency.  Musculoskeletal: Positive for myalgias. Negative for back pain and neck stiffness.  Skin: Negative for rash.  Allergic/Immunologic: Negative for immunocompromised state.  Neurological: Positive for headaches. Negative for syncope and light-headedness.  Hematological: Does not bruise/bleed easily.  Psychiatric/Behavioral: Negative for sleep disturbance. The patient is not nervous/anxious.     Physical Exam Updated Vital Signs BP (!) 125/100 (BP Location: Right Arm)   Pulse Marland Kitchen)  112   Temp 98.3 F (36.8 C) (Oral)   Resp 17   Ht 5\' 2"  (1.575 m)   Wt 83.9 kg   LMP 01/18/2020   SpO2 98%   BMI 33.84 kg/m   Physical Exam Vitals and nursing note reviewed.  Constitutional:      General: She is not in acute distress.    Appearance: She is not diaphoretic.  HENT:     Head: Normocephalic.     Right Ear: Tympanic membrane normal.     Left Ear: Tympanic membrane normal.     Mouth/Throat:     Mouth: Mucous membranes are moist.     Pharynx: Oropharynx is clear. No oropharyngeal exudate or posterior oropharyngeal erythema.  Eyes:     General: No scleral icterus.    Conjunctiva/sclera: Conjunctivae normal.    Cardiovascular:     Rate and Rhythm: Normal rate and regular rhythm.     Pulses: Normal pulses.          Radial pulses are 2+ on the right side and 2+ on the left side.  Pulmonary:     Effort: No tachypnea, accessory muscle usage, prolonged expiration, respiratory distress or retractions.     Breath sounds: No stridor.     Comments: Equal chest rise. No increased work of breathing. Abdominal:     General: There is no distension.     Palpations: Abdomen is soft.     Tenderness: There is no abdominal tenderness. There is no guarding or rebound.  Musculoskeletal:     Cervical back: Normal range of motion.     Comments: Moves all extremities equally and without difficulty.  Skin:    General: Skin is warm and dry.     Capillary Refill: Capillary refill takes less than 2 seconds.  Neurological:     Mental Status: She is alert.     GCS: GCS eye subscore is 4. GCS verbal subscore is 5. GCS motor subscore is 6.     Comments: Speech is clear and goal oriented.  Psychiatric:        Mood and Affect: Mood normal.     ED Results / Procedures / Treatments   Labs (all labs ordered are listed, but only abnormal results are displayed) Labs Reviewed  SARS CORONAVIRUS 2 BY RT PCR (HOSPITAL ORDER, PERFORMED IN [facility]) - Abnormal; Notable for the following components:      Result Value   SARS Coronavirus 2 POSITIVE (*)    All other components within normal limits    EKG None  Radiology No results found.  Procedures Procedures (including critical care time)  Medications Ordered in ED Medications - No data to display  ED Course  I have reviewed the triage vital signs and the nursing notes.  Pertinent labs & imaging results that were available during my care of the patient were reviewed by me and considered in my medical decision making (see chart for details).  Clinical Course as of Sep 18 0001  Fri Jan 19, 2020  2255 Tachycardia at triage  Pulse Rate(!): 112  [HM]    Clinical Course User Index [HM] Wyatt Thorstenson, 2256   MDM Rules/Calculators/A&P                           Jean Nichols was evaluated in Emergency Department on 01/20/2020 for the symptoms described in the history of present illness. She was evaluated in the context of the global  COVID-19 pandemic, which necessitated consideration that the patient might be at risk for infection with the SARS-CoV-2 virus that causes COVID-19. Institutional protocols and algorithms that pertain to the evaluation of patients at risk for COVID-19 are in a state of rapid change based on information released by regulatory bodies including the CDC and federal and state organizations. These policies and algorithms were followed during the patient's care in the ED.  Pt presents with COVID exposure.  COVID + here. No hypoxia here in the ED.  Mild tachycardia at triage no tachycardia on my clinical exam.  Pt will be d/c home with symptomatic therapy.  Discussed reasons to return to the ED.   Final Clinical Impression(s) / ED Diagnoses Final diagnoses:  COVID-19    Rx / DC Orders ED Discharge Orders         Ordered    benzonatate (TESSALON PERLES) 100 MG capsule  3 times daily PRN        01/19/20 2359           Karmello Abercrombie, Dahlia Client, PA-C 01/20/20 0001    Charlynne Pander, MD 01/20/20 [phone]

## 2020-01-20 ENCOUNTER — Encounter: Payer: Self-pay | Admitting: Nurse Practitioner

## 2020-01-20 ENCOUNTER — Telehealth (HOSPITAL_COMMUNITY): Payer: Self-pay | Admitting: Nurse Practitioner

## 2020-01-20 DIAGNOSIS — U071 COVID-19: Secondary | ICD-10-CM

## 2020-01-20 NOTE — Telephone Encounter (Signed)
Sent mychart and text to patient to discuss covid symptoms and the use of regeneron, a monoclonal antibody infusion for those with mild to moderate Covid symptoms and at a high risk of hospitalization.     Pt is qualified for this infusion at the [facility] center due to co-morbid conditions and/or a member of an at-risk group.    Giannamarie Paulus, DNP, AGNP-C [phone] (Infusion Center Hotline)  

## 2020-01-20 NOTE — Discharge Instructions (Signed)
1. Medications: tessalon for cough, Alternate tylenol and ibuprofen for fever control, continue usual home medications 2. Treatment: rest, drink plenty of fluids, isolate for the next 10 days 3. Follow Up: Please followup with your primary doctor if your symptoms are not improving after 10-14 days; Please return to the ER for high fevers, persistent vomiting, shortness of breath or other concerns.

## 2020-01-26 ENCOUNTER — Emergency Department (HOSPITAL_COMMUNITY)
Admission: EM | Admit: 2020-01-26 | Discharge: 2020-01-27 | Disposition: A | Discharge status: Home / Self Care | Payer: 59 | Attending: Emergency Medicine | Admitting: Emergency Medicine

## 2020-01-26 ENCOUNTER — Encounter (HOSPITAL_COMMUNITY): Payer: Self-pay | Admitting: Emergency Medicine

## 2020-01-26 ENCOUNTER — Emergency Department (HOSPITAL_COMMUNITY): Payer: 59

## 2020-01-26 DIAGNOSIS — R197 Diarrhea, unspecified: Secondary | ICD-10-CM | POA: Insufficient documentation

## 2020-01-26 DIAGNOSIS — R112 Nausea with vomiting, unspecified: Secondary | ICD-10-CM | POA: Diagnosis not present

## 2020-01-26 DIAGNOSIS — Z880 Allergy status to penicillin: Secondary | ICD-10-CM | POA: Diagnosis not present

## 2020-01-26 DIAGNOSIS — U071 COVID-19: Secondary | ICD-10-CM | POA: Diagnosis not present

## 2020-01-26 DIAGNOSIS — R000 Tachycardia, unspecified: Secondary | ICD-10-CM | POA: Insufficient documentation

## 2020-01-26 DIAGNOSIS — R0602 Shortness of breath: Secondary | ICD-10-CM | POA: Diagnosis present

## 2020-01-26 DIAGNOSIS — E860 Dehydration: Secondary | ICD-10-CM | POA: Insufficient documentation

## 2020-01-26 DIAGNOSIS — Z79899 Other long term (current) drug therapy: Secondary | ICD-10-CM | POA: Insufficient documentation

## 2020-01-26 LAB — CBC
HCT: 46.8 % — ABNORMAL HIGH (ref 36.0–46.0)
Hemoglobin: 15.2 g/dL — ABNORMAL HIGH (ref 12.0–15.0)
MCH: 28.8 pg (ref 26.0–34.0)
MCHC: 32.5 g/dL (ref 30.0–36.0)
MCV: 88.6 fL (ref 80.0–100.0)
Platelets: 228 10*3/uL (ref 150–400)
RBC: 5.28 MIL/uL — ABNORMAL HIGH (ref 3.87–5.11)
RDW: 12.5 % (ref 11.5–15.5)
WBC: 9.7 10*3/uL (ref 4.0–10.5)
nRBC: 0 % (ref 0.0–0.2)

## 2020-01-26 LAB — BASIC METABOLIC PANEL
Anion gap: 13 (ref 5–15)
BUN: 9 mg/dL (ref 6–20)
CO2: 26 mmol/L (ref 22–32)
Calcium: 8.5 mg/dL — ABNORMAL LOW (ref 8.9–10.3)
Chloride: 96 mmol/L — ABNORMAL LOW (ref 98–111)
Creatinine, Ser: 1.04 mg/dL — ABNORMAL HIGH (ref 0.44–1.00)
GFR calc Af Amer: >60 mL/min (ref >60)
GFR calc non Af Amer: >60 mL/min (ref >60)
Glucose, Bld: 125 mg/dL — ABNORMAL HIGH (ref 70–99)
Potassium: 3.8 mmol/L (ref 3.5–5.1)
Sodium: 135 mmol/L (ref 135–145)

## 2020-01-26 LAB — HEPATIC FUNCTION PANEL
ALT: 54 U/L — ABNORMAL HIGH (ref 0–44)
AST: 46 U/L — ABNORMAL HIGH (ref 15–41)
Albumin: 3.3 g/dL — ABNORMAL LOW (ref 3.5–5.0)
Alkaline Phosphatase: 69 U/L (ref 38–126)
Bilirubin, Direct: 0.2 mg/dL (ref 0.0–0.2)
Indirect Bilirubin: 0.9 mg/dL (ref 0.3–0.9)
Total Bilirubin: 1.1 mg/dL (ref 0.3–1.2)
Total Protein: 7.6 g/dL (ref 6.5–8.1)

## 2020-01-26 LAB — TROPONIN I (HIGH SENSITIVITY): Troponin I (High Sensitivity): 4 ng/L (ref <18)

## 2020-01-26 MED ORDER — ACETAMINOPHEN 500 MG PO TABS
1000.0000 mg | ORAL_TABLET | Freq: Once | ORAL | Status: AC
  Administered 2020-01-26: 1000 mg via ORAL
  Filled 2020-01-26: qty 2

## 2020-01-26 MED ORDER — LACTATED RINGERS IV BOLUS
1000.0000 mL | Freq: Once | INTRAVENOUS | Status: AC
  Administered 2020-01-26: 1000 mL via INTRAVENOUS

## 2020-01-26 MED ORDER — ONDANSETRON 4 MG PO TBDP
4.0000 mg | ORAL_TABLET | Freq: Four times a day (QID) | ORAL | 0 refills | Status: AC | PRN
Start: 2020-01-26 — End: ?

## 2020-01-26 MED ORDER — ONDANSETRON HCL 4 MG/2ML IJ SOLN: 4.0000 mg | Freq: Once | INTRAMUSCULAR | Status: DC

## 2020-01-26 NOTE — ED Notes (Signed)
Pt needed to use the bedside commode, she got dizzy when she stood.  RN was stand by assist.

## 2020-01-26 NOTE — ED Notes (Signed)
Provider aware that pt continues to be orthostatic.

## 2020-01-26 NOTE — ED Provider Notes (Signed)
[facility] EMERGENCY DEPARTMENT Provider Note   CSN: [id number] Arrival date & time: 01/26/20  1146     History Chief Complaint  Patient presents with  . Shortness of Breath  . Covid Positive    Jean Nichols is a 34 y.o. female Jean Nichols is a 34 y.o. female on approximately day 10 of COVID-19 symptoms who presents today for evaluation of shortness of breath.  She is status post bilateral tubal ligation.  She reports that she got one of her Covid vaccines after she had been exposed, however did not have any before hand.  She reports that her shortness of breath is getting worse.  She has cough.  She last had Tylenol shortly before she came here.  She reports nausea, vomiting, and diarrhea.  Has a difficulty keeping down adequate fluids and feels dehydrated.  She feels generally achy however denies any localized specific pain.  HPI     Past Medical History:  Diagnosis Date  . Ectopic pregnancy 2019    Patient Active Problem List   Diagnosis Date Noted  . Ectopic pregnancy 05/04/2017    Past Surgical History:  Procedure Laterality Date  . TUBAL LIGATION       OB History    Gravida  1   Para      Term      Preterm      AB      Living        SAB      TAB      Ectopic      Multiple      Live Births              Family History  Family history unknown: Yes    Social History   Tobacco Use  . Smoking status: Never Smoker  . Smokeless tobacco: Never Used  Vaping Use  . Vaping Use: Never used  Substance Use Topics  . Alcohol use: Never  . Drug use: Never    Home Medications Prior to Admission medications   Medication Sig Start Date End Date Taking? Authorizing Provider  benzonatate (TESSALON PERLES) 100 MG capsule Take 1 capsule (100 mg total) by mouth 3 (three) times daily as needed for cough (cough). 01/19/20   Muthersbaugh, Dahlia Client, PA-C  ibuprofen (ADVIL) 800 MG tablet Take 1 tablet (800 mg total) by mouth 3 (three)  times daily. 08/07/19   Mare Ferrari, PA-C  MEDROL 4 MG TBPK tablet Take by mouth. 12/27/19   [provider]  meloxicam (MOBIC) 15 MG tablet Take 15 mg by mouth daily. 12/21/19   [provider]  methocarbamol (ROBAXIN) 500 MG tablet Take 1 tablet (500 mg total) by mouth 2 (two) times daily. 08/07/19   Mare Ferrari, PA-C  ondansetron (ZOFRAN ODT) 4 MG disintegrating tablet Take 1 tablet (4 mg total) by mouth every 6 (six) hours as needed for nausea or vomiting. 01/26/20   Cristina Gong, PA-C    Allergies    Amoxicillin  Review of Systems   Review of Systems Review of Systems  Constitutional: Positive for chills, fatigue and fever.  Eyes: Negative for visual disturbance.  Respiratory: Positive for cough, chest tightness and shortness of breath.   Cardiovascular: Positive for chest pain (Only when coughing). Negative for palpitations and leg swelling.  Gastrointestinal: Positive for diarrhea, nausea and vomiting. Negative for abdominal pain.  Genitourinary: Negative for dysuria.  Musculoskeletal: Positive for arthralgias and myalgias.  Skin: Negative for color  change, rash and wound.  Neurological: Positive for headaches. Negative for speech difficulty and weakness.   Physical Exam Updated Vital Signs BP (!) 77/53   Pulse 97   Temp 99.9 F (37.7 C) (Oral)   Resp 16   LMP 01/18/2020   SpO2 97%   Physical Exam  Vitals and nursing note reviewed.  Constitutional:      General: She is not in acute distress.    Appearance: She is not ill-appearing.  HENT:     Head: Normocephalic.  Eyes:     Conjunctiva/sclera: Conjunctivae normal.  Cardiovascular:     Rate and Rhythm: Tachycardia present.  Pulmonary:     Effort: Pulmonary effort is normal. No tachypnea or respiratory distress.     Breath sounds: Normal breath sounds. No decreased breath sounds.  Musculoskeletal:     Cervical back: Normal range of motion and neck supple.     Right lower leg: No  tenderness. No edema.     Left lower leg: No tenderness. No edema.  Skin:    General: Skin is warm.  Neurological:     Mental Status: She is alert.     Comments: Awake and alert, answers all questions appropriately.  Speech is not slurred.  Psychiatric:        Mood and Affect: Mood normal.        Behavior: Behavior normal.   ED Results / Procedures / Treatments   Labs (all labs ordered are listed, but only abnormal results are displayed) Labs Reviewed  CBC - Abnormal; Notable for the following components:      Result Value   RBC 5.28 (*)    Hemoglobin 15.2 (*)    HCT 46.8 (*)    All other components within normal limits  BASIC METABOLIC PANEL - Abnormal; Notable for the following components:   Chloride 96 (*)    Glucose, Bld 125 (*)    Creatinine, Ser 1.04 (*)    Calcium 8.5 (*)    All other components within normal limits  HEPATIC FUNCTION PANEL - Abnormal; Notable for the following components:   Albumin 3.3 (*)    AST 46 (*)    ALT 54 (*)    All other components within normal limits  D-DIMER, QUANTITATIVE (NOT AT [facility])  PROCALCITONIN  LACTATE DEHYDROGENASE  FERRITIN  TRIGLYCERIDES  FIBRINOGEN  C-REACTIVE PROTEIN  I-STAT BETA HCG BLOOD, ED ([facility], [facility], [facility] ONLY)  TROPONIN I (HIGH SENSITIVITY)    EKG EKG Interpretation  Date/Time:  Friday January 26 2020 11:48:48 EDT Ventricular Rate:  130 PR Interval:  128 QRS Duration: 80 QT Interval:  300 QTC Calculation: 441 R Axis:   -79 Text Interpretation: Sinus tachycardia Left axis deviation Possible Inferior infarct , age undetermined Possible Anterior infarct , age undetermined Abnormal ECG No previous ECGs available Confirmed by Alvira Monday ([id number]) on 01/26/2020 5:41:58 PM   Radiology DG Chest Portable 1 View  Result Date: 01/26/2020 CLINICAL DATA:  Shortness of breath.  COVID positive. EXAM: PORTABLE CHEST 1 VIEW COMPARISON:  11/16/2019 FINDINGS: The cardiac silhouette, mediastinal and hilar contours are within  normal limits. Patchy ill-defined lower lobe infiltrates consistent with COVID pneumonia. No pleural effusions or pulmonary lesions. The bony thorax is intact. IMPRESSION: Patchy bilateral lower lobe infiltrates consistent with COVID pneumonia. Electronically Signed   By: Rudie Meyer M.D.   On: 01/26/2020 12:52    Procedures Procedures (including critical care time)  Medications Ordered in ED Medications  ondansetron (ZOFRAN) injection 4 mg (has  no administration in time range)  lactated ringers bolus 1,000 mL (0 mLs Intravenous Stopped 01/26/20 2144)  acetaminophen (TYLENOL) tablet 1,000 mg (1,000 mg Oral Given 01/26/20 1909)  lactated ringers bolus 1,000 mL (1,000 mLs Intravenous New Bag/Given 01/26/20 2315)    ED Course  I have reviewed the triage vital signs and the nursing notes.  Pertinent labs & imaging results that were available during my care of the patient were reviewed by me and considered in my medical decision making (see chart for details).  Clinical Course as of Jan 25 2330  Fri Jan 26, 2020  2141 Plan for PO challenge, zofran PRN, Ambulate, orthostatics and re-evaluation    [EH]  2216 Patient is reevaluated. She was significantly orthostatic, laying to standing at 3 minutes her heart rate went from 99 up to 135 and her blood pressure went from 121/64 down to 97/54 and she was symptomatic. She has not yet urinated. Additional liter of fluid ordered   [EH]  2305 Was a part of orthostatics after patient sat down.  Given the degree of hypotension, with patient being on day 10 of Covid, will obtain CTA PE study to evaluate for PE causing her hypotension.  BP(!): 77/53 [EH]    Clinical Course User Index [EH] Cristina Gong, PA-C   Orthostatic VS for the past 24 hrs:  BP- Lying Pulse- Lying BP- Sitting Pulse- Sitting BP- Standing at 0 minutes Pulse- Standing at 0 minutes  01/26/20 2153 121/64 99 128/71 114 96/53 128      MDM Rules/Calculators/A&P                           Doretta Remmert was evaluated in Emergency Department on 01/26/2020 for the symptoms described in the history of present illness. She was evaluated in the context of the global COVID-19 pandemic, which necessitated consideration that the patient might be at risk for infection with the SARS-CoV-2 virus that causes COVID-19. Institutional protocols and algorithms that pertain to the evaluation of patients at risk for COVID-19 are in a state of rapid change based on information released by regulatory bodies including the CDC and federal and state organizations. These policies and algorithms were followed during the patient's care in the ED.  Patient is a 34 year old woman on day 10 of Covid symptoms who feels like her shortness of breath is worsening.  Chart review shows it appears monoclonal antibody infusion clinic reached out to her however she does not appear to have gotten the Mab infusion.  Labs are obtained and reviewed, she does not have a significant leukocytosis.  Her hemoglobin is slightly elevated at 15.2 consistent with possible reported dehydration.  BMP is unremarkable.  Hepatic function panel shows minimal transaminitis.  Chest x-ray shows patchy bilateral lower lobe infiltrates consistent with Covid.  While in the emergency room she was noted to be tachycardic up into the 130s and nearly febrile with a temp of 100.3.  She was treated with IV fluids.  She reported poor p.o. intake along with nausea vomiting and diarrhea therefore she is given 1 L of IV fluids as she reported she was frequently feeling lightheaded.  After this orthostatics were obtained and she was still markedly orthostatic, and it was noted by the tach that after patient sat down in bed her blood pressure dropped to 77/53.  She has been up to urinate, suggesting that she is rehydrated enough to produce adequate urine, however remains light headed.  Given the degree of orthostatic changes after IV fluids and PO tylenol  CTA PE study is ordered.  Increased concern for PE due to her being on day 10 of covid.  Additional fluid bolus ordered.  Covid specific labs such as d-dimer, LDH, Triglycerides, procalcitonin and triglycerides are ordered.   At shift change [facility] who will follow pending studies, re-evaulate and determine disposition.     Note: Portions of this report may have been transcribed using voice recognition software. Every effort was made to ensure accuracy; however, inadvertent computerized transcription errors may be present   Final Clinical Impression(s) / ED Diagnoses Final diagnoses:  COVID-19  Tachycardia  SOB (shortness of breath)  Dehydration    Rx / DC Orders ED Discharge Orders         Ordered    ondansetron (ZOFRAN ODT) 4 MG disintegrating tablet  Every 6 hours PRN        01/26/20 2218           Cristina GongHammond, Summer Parthasarathy W, PA-C 01/26/20 2336    Alvira MondaySchlossman, Erin, MD 01/29/20 [phone]

## 2020-01-26 NOTE — ED Notes (Signed)
Unsuccessful IV attempt. Veins blew.

## 2020-01-26 NOTE — ED Triage Notes (Signed)
Pt arrives via [facility] for c/o ongoing sob, recently diagnosed with covid on 9/17, states she feels like sob is worsening. O2 sat 95-97% on room air with ems. BP 118/90, HR 128, 22-24RR, temp 98.4. CBG 146.

## 2020-01-26 NOTE — ED Notes (Signed)
Pt requested water, day shift RN said the pt could have water

## 2020-01-26 NOTE — ED Notes (Addendum)
Pt ambulated in the room. Oxygen saturations remained at 95%-100%. Pt given sprite and graham crackers.

## 2020-01-26 NOTE — Discharge Instructions (Addendum)
Your CT scan did not show any evidence of a blood clot.  It does show findings consistent with Covid pneumonia.  There was questionable enlargement of your gallbladder with questionable gallstone.  If you develop severe right-sided upper abdominal pain, you should return to the emergency department.  Otherwise, please discuss these findings with your doctor.  While you are on day 10 of symptoms you need to remain in quarantine until you have been feeling better, fever free without Tylenol or other fever reducing medications for 48 hours.  Today you were given a prescription for Zofran. This is a nausea medicine that dissolves in your mouth. Please make sure that you are drinking plenty of water and eating what you can. I would try bland foods such as bananas, rice, applesauce, and toast.

## 2020-01-27 ENCOUNTER — Emergency Department (HOSPITAL_COMMUNITY): Payer: 59

## 2020-01-27 LAB — I-STAT BETA HCG BLOOD, ED (MC, WL, AP ONLY): I-stat hCG, quantitative: <5 m[IU]/mL (ref <5)

## 2020-01-27 LAB — TRIGLYCERIDES: Triglycerides: 101 mg/dL (ref <150)

## 2020-01-27 LAB — PROCALCITONIN: Procalcitonin: <0.1 ng/mL

## 2020-01-27 LAB — C-REACTIVE PROTEIN: CRP: 1.3 mg/dL — ABNORMAL HIGH (ref <1.0)

## 2020-01-27 LAB — FIBRINOGEN: Fibrinogen: 464 mg/dL (ref 210–475)

## 2020-01-27 LAB — FERRITIN: Ferritin: 547 ng/mL — ABNORMAL HIGH (ref 11–307)

## 2020-01-27 LAB — LACTATE DEHYDROGENASE: LDH: 265 U/L — ABNORMAL HIGH (ref 98–192)

## 2020-01-27 LAB — D-DIMER, QUANTITATIVE: D-Dimer, Quant: 0.72 ug/mL-FEU — ABNORMAL HIGH (ref 0.00–0.50)

## 2020-01-27 MED ORDER — BENZONATATE 100 MG PO CAPS: 100.0000 mg | ORAL_CAPSULE | Freq: Three times a day (TID) | ORAL | 0 refills | Status: AC

## 2020-01-27 MED ORDER — IOHEXOL 350 MG/ML SOLN
100.0000 mL | Freq: Once | INTRAVENOUS | Status: AC | PRN
  Administered 2020-01-27: 100 mL via INTRAVENOUS

## 2020-01-27 NOTE — ED Provider Notes (Signed)
Patient signed out to me at shift change.  Patient was orthostatic.  Patient receiving fluids.  PE study negative for PE.  Findings are consistent with Covid pneumonia.  CT also showed possible increased gallbladder bile concentration versus contrast excretion and questionable nonradiopaque stone.  Patient has mildly elevated transaminases.  She does not have any focal right upper abdominal pain.  I doubt acute cholecystitis.  Patient given instructions to return for new or worsening symptoms.   Roxy Horseman, PA-C 01/27/20 0536    Nira Conn, MD 01/27/20 [phone]

## 2022-03-15 IMAGING — CT CT ANGIO CHEST
2 of 6 series · 18 of 46 positions shown · IV contrast (omnipaque)
Comparison: Chest radiograph 01/26/2020

CLINICAL DATA: Suspected pulmonary embolus with high probability.
Day 10 COVID. Shortness of breath.

EXAM:
CT ANGIOGRAPHY CHEST WITH CONTRAST
TECHNIQUE: Multidetector CT imaging of the chest was performed using the
standard protocol during bolus administration of intravenous
contrast. Multiplanar CT image reconstructions and MIPs were
obtained to evaluate the vascular anatomy.
CONTRAST:  53 mL OMNIPAQUE IOHEXOL 350 MG/ML SOLN

[Series 7: thins · axial · 0.90mm/px · z∈[+1206,+1473]mm · 15 of 293 slices shown]
[im 13/293  lung]
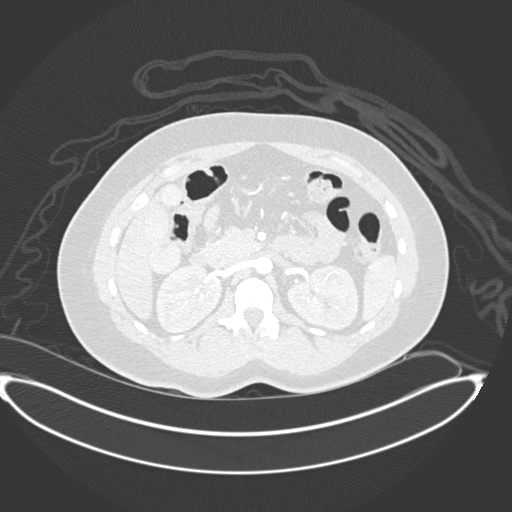
[im 39/293  soft-tissue]
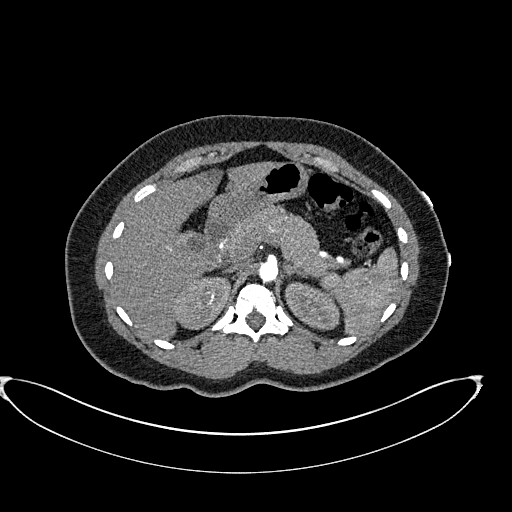
[im 51/293  lung]
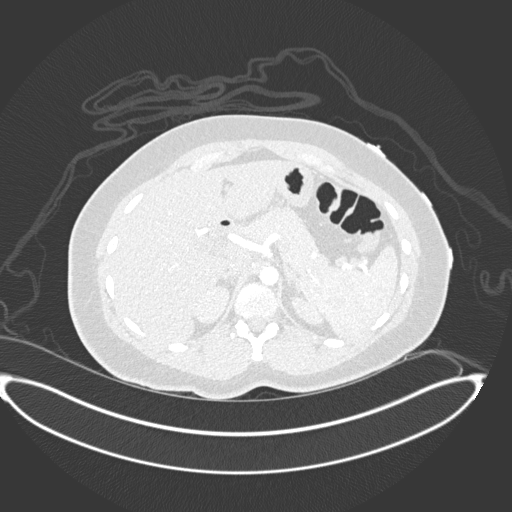
[im 77/293  soft-tissue]
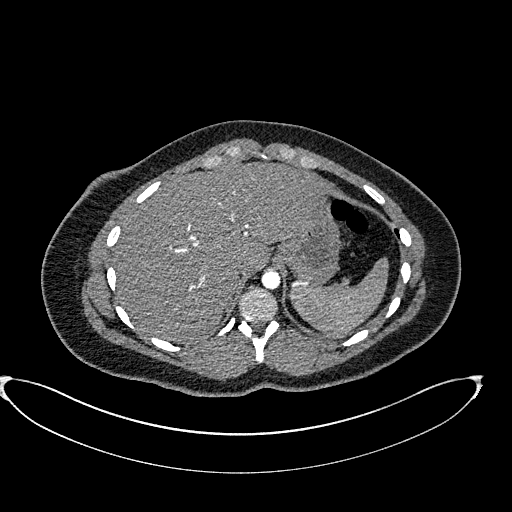
[im 89/293  lung]
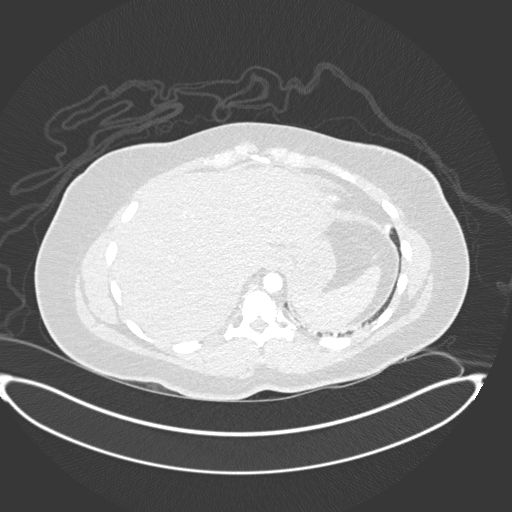
[im 115/293  soft-tissue]
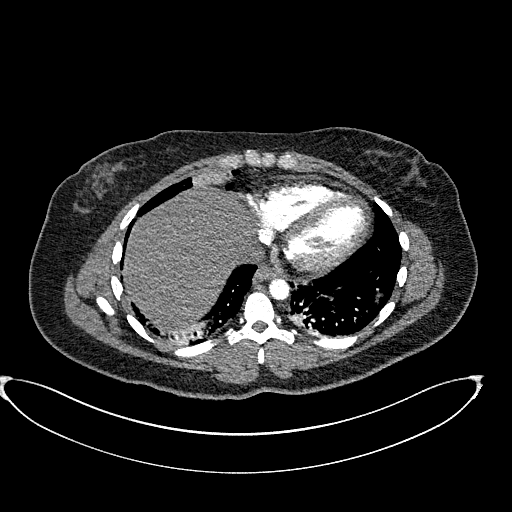
[im 127/293  lung]
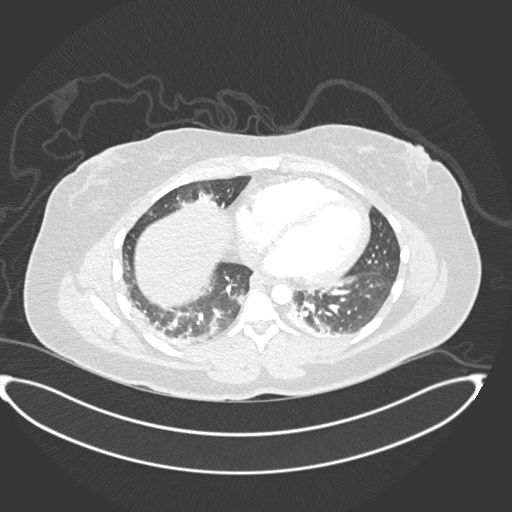
[im 153/293  soft-tissue]
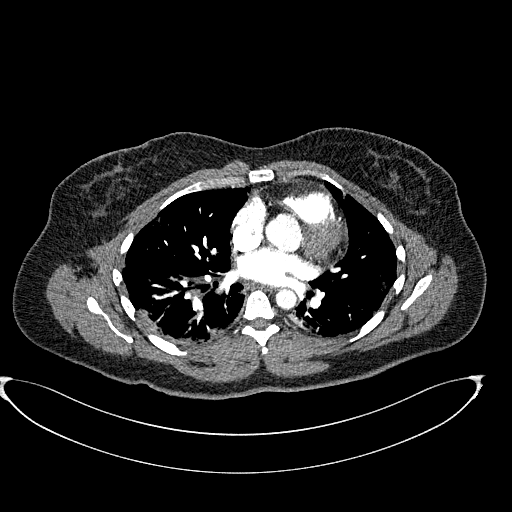
[im 166/293  lung]
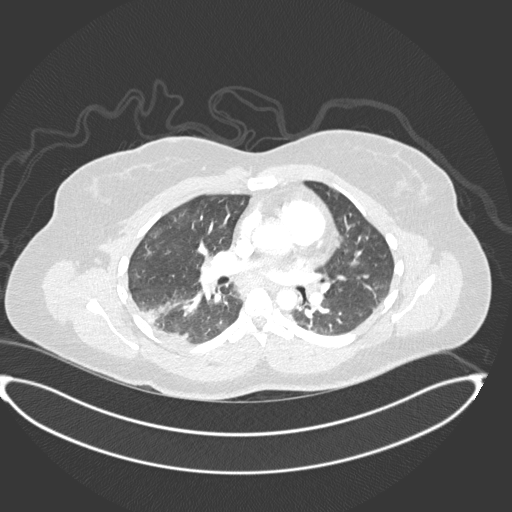
[im 178/293  soft-tissue]
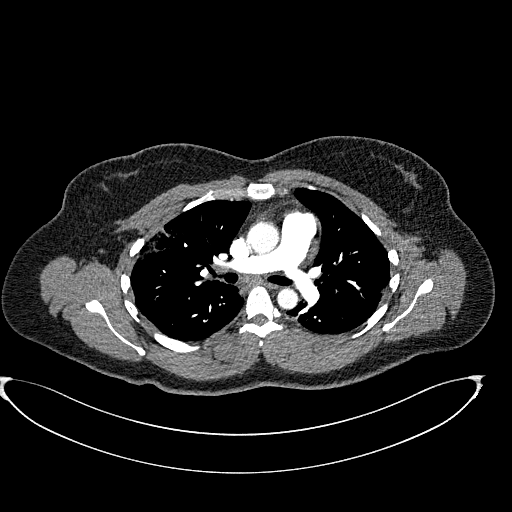
[im 204/293  lung]
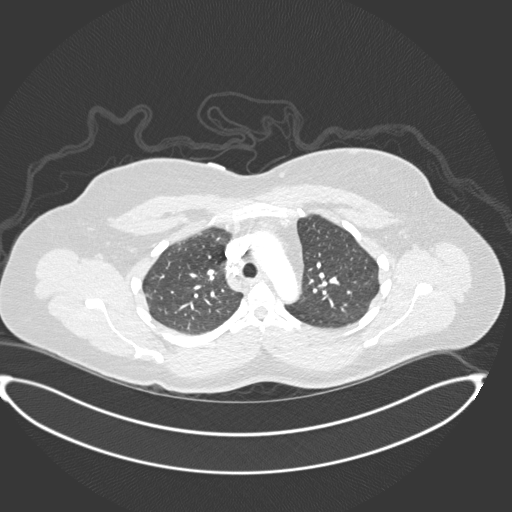
[im 216/293  soft-tissue]
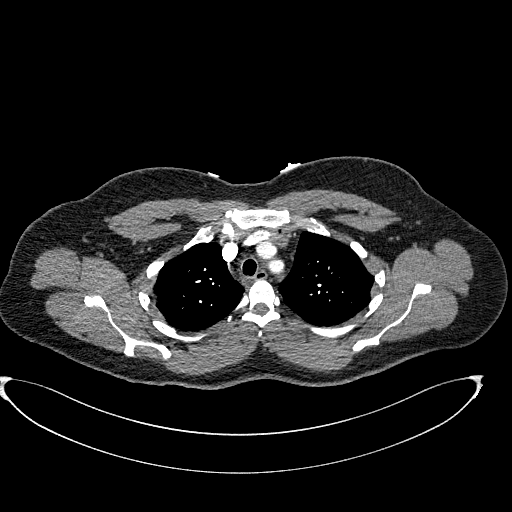
[im 242/293  lung]
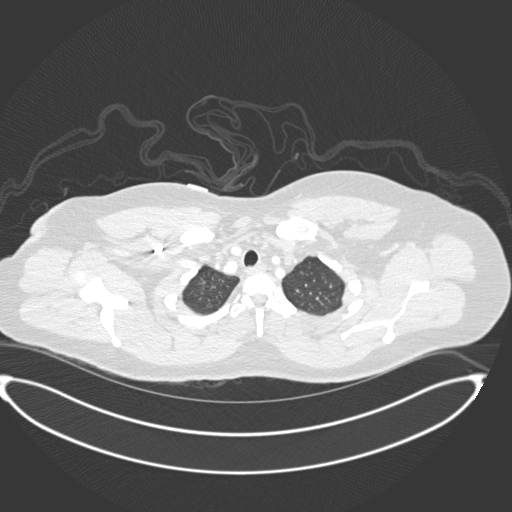
[im 254/293  soft-tissue]
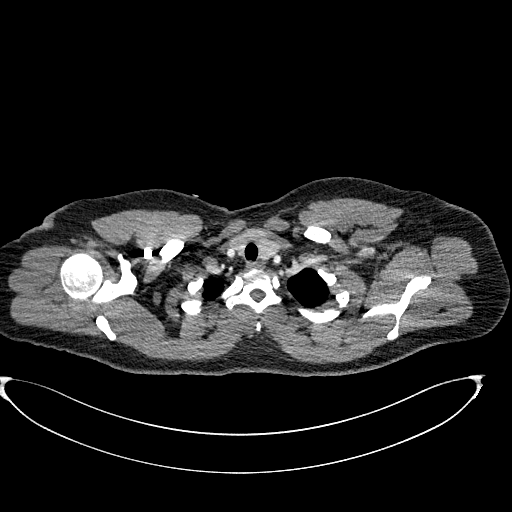
[im 280/293  lung]
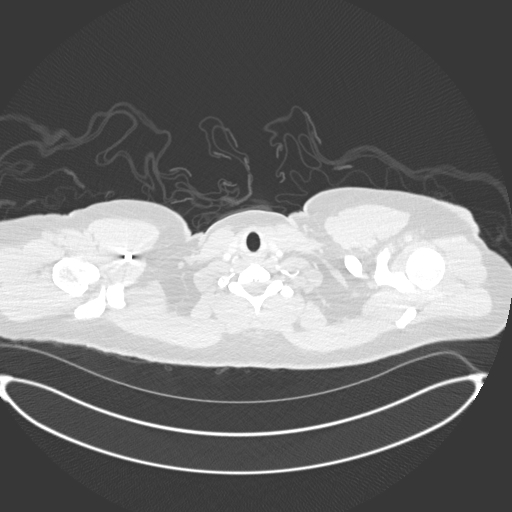

[Series 9: coronal mpr · coronal · 0.59mm/px · 3 of 149 slices shown]
[im 38/149  soft-tissue]
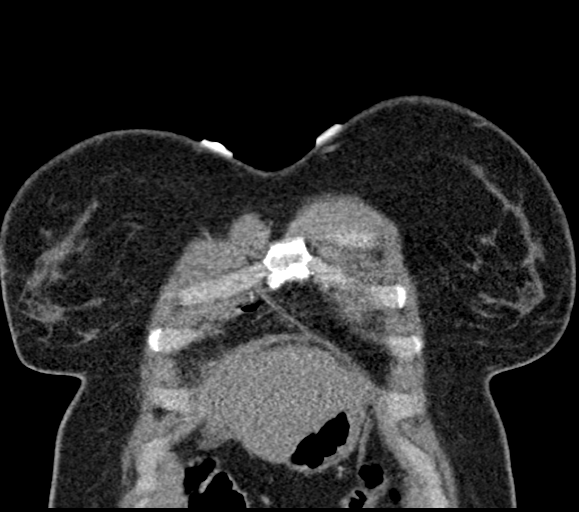
[im 75/149  soft-tissue]
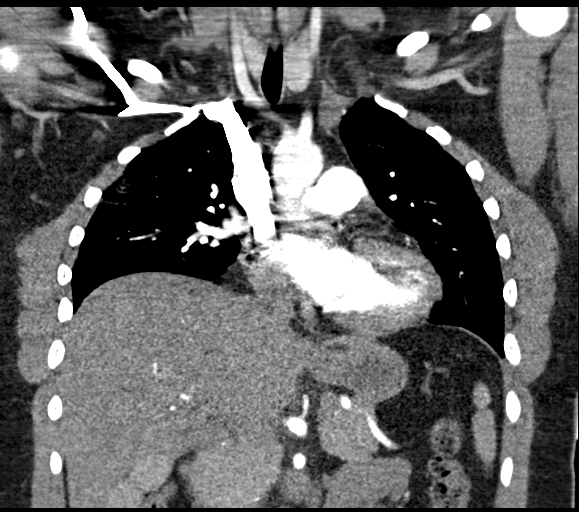
[im 112/149  soft-tissue]
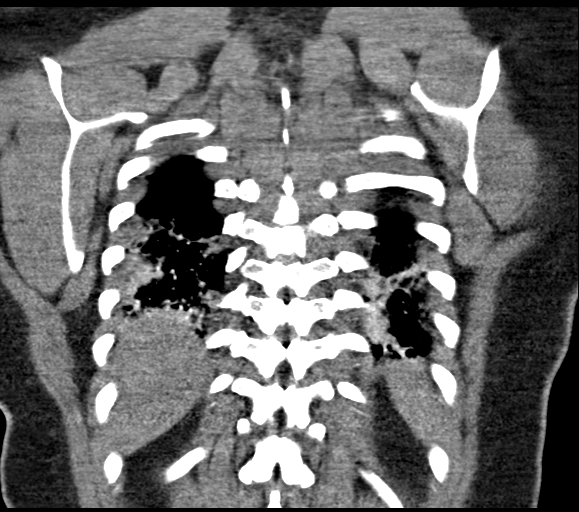

[18 of 46 positions shown; findings below may reference images not displayed]

FINDINGS: Cardiovascular: Good opacification of the central and segmental
pulmonary arteries. No focal filling defects. No evidence of
significant pulmonary embolus. Normal heart size. No pericardial
effusions. Normal caliber thoracic aorta. No aortic dissection.
Great vessel origins are patent.

Mediastinum/Nodes: No significant lymphadenopathy. Esophagus is
decompressed.

Lungs/Pleura: Patchy peripheral and basilar airspace infiltrates
likely representing multifocal pneumonia and compatible with COVID
pneumonia. No pleural effusions. No pneumothorax. Airways are
patent.

Upper Abdomen: Increased gallbladder density possibly representing
vicarious contrast excretion or concentrated bile. Lower density in
the gallbladder neck suggesting a non radiopaque stone. Consider
ultrasound to exclude cholecystitis.

Musculoskeletal: No chest wall abnormality. No acute or significant
osseous findings.

Review of the MIP images confirms the above findings.
IMPRESSION: 1. No evidence of significant pulmonary embolus.
2. Patchy peripheral and basilar airspace infiltrates likely
representing multifocal pneumonia and compatible with COVID
pneumonia.
3. Increased gallbladder density possibly representing vicarious
contrast excretion or concentrated bile. Lower density in the
gallbladder neck suggesting a non radiopaque stone. Consider
ultrasound to exclude cholecystitis.
